# Patient Record
Sex: Female | Born: 1950 | Hispanic: No | Marital: Married | Smoking: Never smoker
Health system: Southern US, Community
[De-identification: ages and names within clinical notes are randomized; demographics above are authoritative.]

---

## 2006-11-09 ENCOUNTER — Emergency Department (HOSPITAL_COMMUNITY): Admission: EM | Admit: 2006-11-09 | Discharge: 2006-11-09 | Payer: Self-pay | Admitting: Family Medicine

## 2007-02-12 ENCOUNTER — Emergency Department (HOSPITAL_COMMUNITY): Admission: EM | Admit: 2007-02-12 | Discharge: 2007-02-12 | Payer: Self-pay | Admitting: Emergency Medicine

## 2007-09-13 ENCOUNTER — Encounter (INDEPENDENT_AMBULATORY_CARE_PROVIDER_SITE_OTHER): Payer: Self-pay | Admitting: Nurse Practitioner

## 2007-09-13 ENCOUNTER — Ambulatory Visit: Payer: Self-pay | Admitting: Internal Medicine

## 2007-09-13 DIAGNOSIS — M67919 Unspecified disorder of synovium and tendon, unspecified shoulder: Secondary | ICD-10-CM | POA: Insufficient documentation

## 2007-09-13 DIAGNOSIS — M25539 Pain in unspecified wrist: Secondary | ICD-10-CM | POA: Insufficient documentation

## 2007-09-13 DIAGNOSIS — M719 Bursopathy, unspecified: Secondary | ICD-10-CM

## 2007-09-14 ENCOUNTER — Encounter (INDEPENDENT_AMBULATORY_CARE_PROVIDER_SITE_OTHER): Payer: Self-pay | Admitting: Nurse Practitioner

## 2007-09-14 LAB — CONVERTED CEMR LAB
AST: 19 units/L (ref 0–37)
Alkaline Phosphatase: 77 units/L (ref 39–117)
BUN: 11 mg/dL (ref 6–23)
Basophils Absolute: 0 10*3/uL (ref 0.0–0.1)
Basophils Relative: 0 % (ref 0–1)
Creatinine, Ser: 0.63 mg/dL (ref 0.40–1.20)
Eosinophils Absolute: 0.1 10*3/uL (ref 0.0–0.7)
HDL: 41 mg/dL (ref >39)
Hemoglobin: 14.2 g/dL (ref 12.0–15.0)
LDL Cholesterol: 108 mg/dL — ABNORMAL HIGH (ref 0–99)
MCHC: 34 g/dL (ref 30.0–36.0)
MCV: 90.3 fL (ref 78.0–100.0)
Monocytes Absolute: 0.4 10*3/uL (ref 0.1–1.0)
Monocytes Relative: 10 % (ref 3–12)
RBC: 4.63 M/uL (ref 3.87–5.11)
RDW: 14.8 % (ref 11.5–15.5)
Total Bilirubin: 0.3 mg/dL (ref 0.3–1.2)
Total CHOL/HDL Ratio: 4.6
VLDL: 40 mg/dL (ref 0–40)

## 2007-09-27 ENCOUNTER — Ambulatory Visit: Payer: Self-pay | Admitting: Nurse Practitioner

## 2007-09-27 ENCOUNTER — Ambulatory Visit (HOSPITAL_COMMUNITY): Admission: RE | Admit: 2007-09-27 | Discharge: 2007-09-27 | Payer: Self-pay | Admitting: Nurse Practitioner

## 2007-09-27 DIAGNOSIS — K219 Gastro-esophageal reflux disease without esophagitis: Secondary | ICD-10-CM

## 2007-10-18 ENCOUNTER — Ambulatory Visit: Payer: Self-pay | Admitting: Nurse Practitioner

## 2007-10-18 DIAGNOSIS — R109 Unspecified abdominal pain: Secondary | ICD-10-CM | POA: Insufficient documentation

## 2007-11-08 ENCOUNTER — Ambulatory Visit (HOSPITAL_COMMUNITY): Admission: RE | Admit: 2007-11-08 | Discharge: 2007-11-08 | Payer: Self-pay | Admitting: Internal Medicine

## 2007-12-05 ENCOUNTER — Ambulatory Visit: Payer: Self-pay | Admitting: Nurse Practitioner

## 2007-12-05 DIAGNOSIS — K7689 Other specified diseases of liver: Secondary | ICD-10-CM

## 2007-12-05 DIAGNOSIS — K8020 Calculus of gallbladder without cholecystitis without obstruction: Secondary | ICD-10-CM | POA: Insufficient documentation

## 2007-12-05 LAB — CONVERTED CEMR LAB
ALT: 23 units/L (ref 0–35)
AST: 23 units/L (ref 0–37)
Alkaline Phosphatase: 79 units/L (ref 39–117)
Glucose, Bld: 104 mg/dL — ABNORMAL HIGH (ref 70–99)
Sodium: 142 meq/L (ref 135–145)
Total Bilirubin: 0.2 mg/dL — ABNORMAL LOW (ref 0.3–1.2)
Total Protein: 7.7 g/dL (ref 6.0–8.3)

## 2007-12-14 ENCOUNTER — Telehealth (INDEPENDENT_AMBULATORY_CARE_PROVIDER_SITE_OTHER): Payer: Self-pay | Admitting: Nurse Practitioner

## 2008-01-07 ENCOUNTER — Encounter (INDEPENDENT_AMBULATORY_CARE_PROVIDER_SITE_OTHER): Payer: Self-pay | Admitting: Nurse Practitioner

## 2008-02-18 ENCOUNTER — Ambulatory Visit: Payer: Self-pay | Admitting: Nurse Practitioner

## 2008-02-18 DIAGNOSIS — F4310 Post-traumatic stress disorder, unspecified: Secondary | ICD-10-CM

## 2008-02-18 DIAGNOSIS — M2550 Pain in unspecified joint: Secondary | ICD-10-CM | POA: Insufficient documentation

## 2008-02-18 LAB — CONVERTED CEMR LAB
ALT: 16 units/L (ref 0–35)
Albumin: 4.2 g/dL (ref 3.5–5.2)
CO2: 25 meq/L (ref 19–32)
Calcium: 8.8 mg/dL (ref 8.4–10.5)
Chloride: 106 meq/L (ref 96–112)
Eosinophils Relative: 3 % (ref 0–5)
HCT: 40.4 % (ref 36.0–46.0)
Hep B E Ab: POSITIVE — AB
Hep B S Ab: POSITIVE — AB
Lymphocytes Relative: 50 % — ABNORMAL HIGH (ref 12–46)
Lymphs Abs: 2.1 10*3/uL (ref 0.7–4.0)
Neutrophils Relative %: 36 % — ABNORMAL LOW (ref 43–77)
Platelets: 288 10*3/uL (ref 150–400)
Sed Rate: 22 mm/hr (ref 0–22)
Sodium: 141 meq/L (ref 135–145)
Total Protein: 7.7 g/dL (ref 6.0–8.3)
Uric Acid, Serum: 5.7 mg/dL (ref 2.4–7.0)
WBC: 4.1 10*3/uL (ref 4.0–10.5)

## 2008-02-20 ENCOUNTER — Encounter (INDEPENDENT_AMBULATORY_CARE_PROVIDER_SITE_OTHER): Payer: Self-pay | Admitting: Nurse Practitioner

## 2008-03-07 ENCOUNTER — Telehealth (INDEPENDENT_AMBULATORY_CARE_PROVIDER_SITE_OTHER): Payer: Self-pay | Admitting: Nurse Practitioner

## 2008-03-25 ENCOUNTER — Encounter (INDEPENDENT_AMBULATORY_CARE_PROVIDER_SITE_OTHER): Payer: Self-pay | Admitting: *Deleted

## 2008-04-21 ENCOUNTER — Ambulatory Visit: Payer: Self-pay | Admitting: Surgery

## 2008-04-21 ENCOUNTER — Encounter (INDEPENDENT_AMBULATORY_CARE_PROVIDER_SITE_OTHER): Payer: Self-pay | Admitting: Internal Medicine

## 2008-04-21 ENCOUNTER — Inpatient Hospital Stay (HOSPITAL_COMMUNITY): Admission: EM | Admit: 2008-04-21 | Discharge: 2008-04-25 | Payer: Self-pay | Admitting: Emergency Medicine

## 2008-10-04 IMAGING — NM NM HEPATO W/GB/PHARM/[PERSON_NAME]
3 series · 13 of 13 positions shown · non-contrast
Comparison: 04/21/2008

CLINICAL DATA: 57-year-old male chest and abdominal pain, vomiting

NUCLEAR MEDICINE HEPATOBILIARY IMAGING
TECHNIQUE: Sequential images of the abdomen were obtained [DATE] minutes following intravenous administration of
radiopharmaceutical.
Radiopharmaceutical:  5mCi Vc-66m Choletec

[Series 1: he hepatobiliary · 3.22mm/px · 6 of 46 frames shown (1 of 2)]
[frame 4/46]
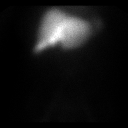
[frame 12/46]
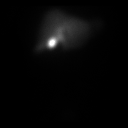
[frame 20/46]
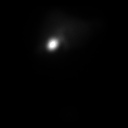
[frame 27/46]
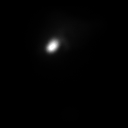
[frame 35/46]
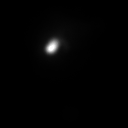
[frame 43/46]
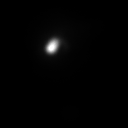

[Series 1: st static image · 1 of 1 slices shown]
[im 1/1]
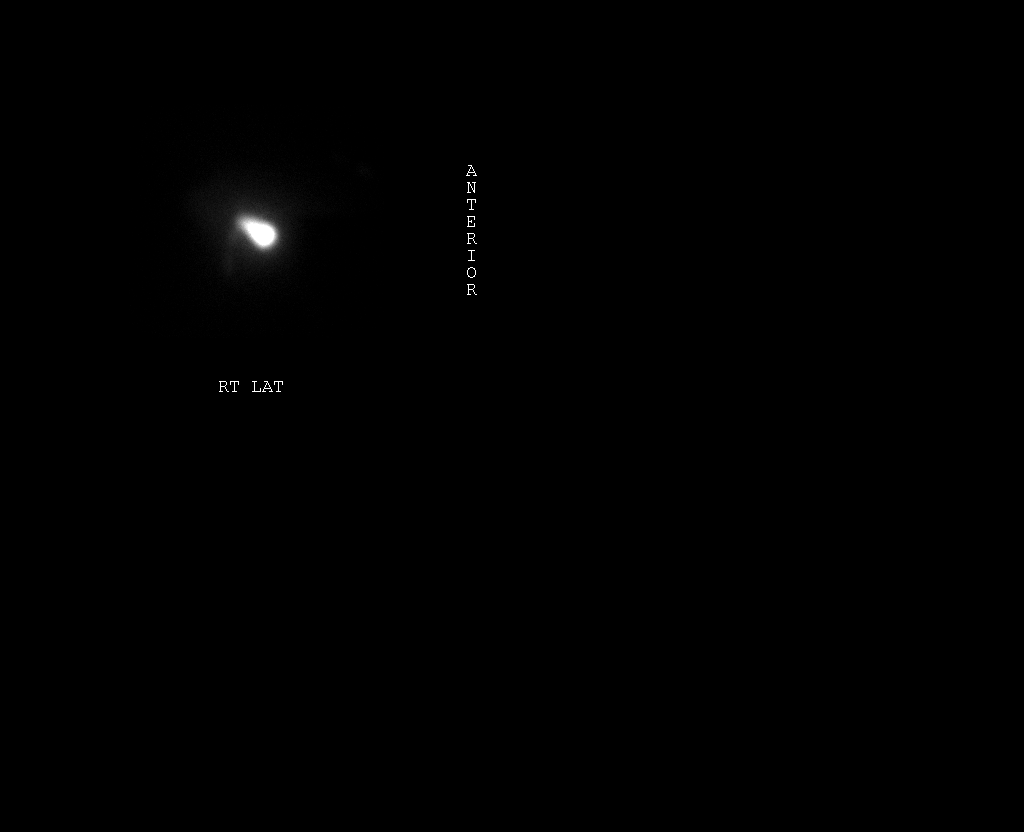

[Series 1: he hepatobiliary · 3.22mm/px · 6 of 60 frames shown (2 of 2)]
[frame 6/60]
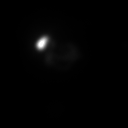
[frame 16/60]
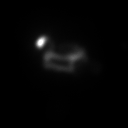
[frame 26/60]
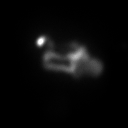
[frame 36/60]
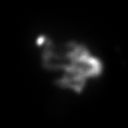
[frame 46/60]
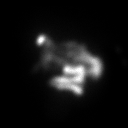
[frame 56/60]
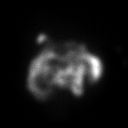

[13 of 13 positions shown; findings below may reference images not displayed]

FINDINGS: There is normal hepatic uptake of the radiotracer and
biliary excretion evident at 10 minutes.  The gallbladder is also
visualized at 10 minutes.

After 60 minutes, half-and-half creamer was administered orally for
gallbladder stimulation.  Gallbladder ejection fraction at 60
minutes was normal measuring 96%. No symptoms were elicited from
the patient.

Normal gallbladder ejection fraction is greater than or equal to
50% of 16 minutes.

 Activity progresses throughout the small bowel.  The cystic duct
and common bile duct are both patent.
IMPRESSION: Patent cystic duct and common bile duct.

Negative for acute cholecystitis.

Normal gallbladder ejection fraction measuring 96%

## 2008-10-20 ENCOUNTER — Ambulatory Visit: Payer: Self-pay | Admitting: Nurse Practitioner

## 2008-10-20 DIAGNOSIS — M25569 Pain in unspecified knee: Secondary | ICD-10-CM

## 2008-10-20 LAB — CONVERTED CEMR LAB
ALT: 15 U/L (ref 0–35)
AST: 13 U/L (ref 0–37)
Albumin: 3.9 g/dL (ref 3.5–5.2)
Alkaline Phosphatase: 71 U/L (ref 39–117)
BUN: 8 mg/dL (ref 6–23)
Basophils Absolute: 0 K/uL (ref 0.0–0.1)
Basophils Relative: 0 % (ref 0–1)
CO2: 23 meq/L (ref 19–32)
Calcium: 8.9 mg/dL (ref 8.4–10.5)
Chloride: 106 meq/L (ref 96–112)
Creatinine, Ser: 0.57 mg/dL (ref 0.40–1.20)
Eosinophils Absolute: 0.1 K/uL (ref 0.0–0.7)
Eosinophils Relative: 2 % (ref 0–5)
Glucose, Bld: 96 mg/dL (ref 70–99)
HCT: 39.6 % (ref 36.0–46.0)
Hemoglobin: 13.2 g/dL (ref 12.0–15.0)
Lymphocytes Relative: 45 % (ref 12–46)
Lymphs Abs: 1.7 K/uL (ref 0.7–4.0)
MCHC: 33.3 g/dL (ref 30.0–36.0)
MCV: 89.6 fL (ref 78.0–100.0)
Monocytes Absolute: 0.4 K/uL (ref 0.1–1.0)
Monocytes Relative: 10 % (ref 3–12)
Neutro Abs: 1.6 K/uL — ABNORMAL LOW (ref 1.7–7.7)
Neutrophils Relative %: 42 % — ABNORMAL LOW (ref 43–77)
Platelets: 280 K/uL (ref 150–400)
Potassium: 4.4 meq/L (ref 3.5–5.3)
RBC: 4.42 M/uL (ref 3.87–5.11)
RDW: 15 % (ref 11.5–15.5)
Sodium: 143 meq/L (ref 135–145)
Total Bilirubin: 0.3 mg/dL (ref 0.3–1.2)
Total Protein: 7.5 g/dL (ref 6.0–8.3)
WBC: 3.7 10*3/microliter — ABNORMAL LOW (ref 4.0–10.5)

## 2009-04-10 ENCOUNTER — Encounter (INDEPENDENT_AMBULATORY_CARE_PROVIDER_SITE_OTHER): Payer: Self-pay | Admitting: Nurse Practitioner

## 2009-04-14 ENCOUNTER — Encounter (INDEPENDENT_AMBULATORY_CARE_PROVIDER_SITE_OTHER): Payer: Self-pay | Admitting: Nurse Practitioner

## 2010-06-28 ENCOUNTER — Ambulatory Visit (HOSPITAL_BASED_OUTPATIENT_CLINIC_OR_DEPARTMENT_OTHER)
Admission: RE | Admit: 2010-06-28 | Discharge: 2010-06-28 | Payer: Self-pay | Source: Home / Self Care | Admitting: Internal Medicine

## 2010-06-28 ENCOUNTER — Ambulatory Visit: Payer: Self-pay | Admitting: Diagnostic Radiology

## 2011-01-11 NOTE — H&P (Signed)
NAMEBERDENA, CISEK    ACCOUNT NO.:  [id number]   MEDICAL RECORD NO.:  [id number]          PATIENT TYPE:  INP   LOCATION:  5530                         FACILITY:  [facility]   PHYSICIAN:  Della Goo, M.D. DATE OF BIRTH:  1951-06-28   DATE OF ADMISSION:  04/21/2008  DATE OF DISCHARGE:                              HISTORY & PHYSICAL   CHIEF COMPLAINT:  Chest pain.   HISTORY OF PRESENT ILLNESS:  This is a 60 year old non-English speaking  female who was brought to the emergency department by ambulance,  secondary to complaints of worsening chest pain that she has had off and  on since Wednesday.  The history and physical interview was done through  the assistance of the interpreter translation line for Swahili, and the  patient's daughter who was at bedside.  The patient reports having  heaviness in her chest, left-sided.  Rate of this pain is being a 10 out  of 10, associated with shortness of breath, lightheadedness and  diaphoresis.  The patient reports having nausea prior to the onset of  her chest pain, followed by 2 episodes of vomiting.  She denies having  any hematemesis.  The patient reports having a previous similar episode  while she was in [place].  The patient reports feeling lightheaded, but  not feeling as though she would pass out.   PAST MEDICAL HISTORY:  None.   MEDICATIONS:  None.   ALLERGIES:  ASPIRIN.   SOCIAL HISTORY:  The patient is a smoker.  She denies drinking alcohol  or any illicit drug usage.   FAMILY HISTORY:  Noncontributory.   PHYSICAL EXAMINATION FINDINGS:  This is a 60 year old well-nourished,  well-developed female in no acute distress.  VITAL SIGNS:  Temperature 97.1, blood pressure initially 150/80, heart  rate 96, respirations 20, O2 saturations 100%.  HEENT EXAMINATION:  Normocephalic and atraumatic.  There is no scleral  icterus.  Pupils equally round and reactive to light.  Extraocular  movements intact.  Funduscopic benign.   Oropharynx clear.  NECK:  Supple; full range of motion.  No thyromegaly, adenopathy or  jugular venous distention.  CARDIOVASCULAR:  Regular rate and rhythm.  No murmurs, gallops or rubs.  LUNGS:  Clear to auscultation bilaterally.  ABDOMEN:  Positive bowel sounds.  Soft, nontender and nondistended.  EXTREMITIES:  Without cyanosis, clubbing or edema.  NEUROLOGIC EXAMINATION:  Nonfocal.   LABORATORY STUDIES:  White blood cell count 5.4, hemoglobin 13.4,  hematocrit 39.9, platelets 234, neutrophils 46%, lymphocytes 41%.  D-  dimer 0.38.  Urine pregnancy test negative.  Sodium 140, potassium 3.9,  chloride 110, bicarb 26, BUN 14, creatinine 0.8, glucose 85.  Cardiac  Enzymes:  Creatinine kinase 81, CK-MB 1.6, troponin 0.01.   CHEST X-RAY:  Reveals decreased lung volumes and bibasilar atelectasis.   EKG:  Reveals a normal sinus rhythm and nonspecific ST segment changes.   ASSESSMENT:  A 60 year old female being admitted with:  1. Chest pain.  2. Transient elevated blood pressures, possibly secondary to      discomfort.  3. Abdominal pain.   PLAN:  The patient will be admitted to a telemetry area for cardiac  monitoring.  Cardiac  enzymes will be performed.  The patient will be  placed on nitrates and oxygen therapy.  The patient reports being  allergic to aspirin, so this will not be ordered.  Low-dose beta blocker  therapy has also been ordered, and DVT and GI prophylaxis have been  ordered.  Further workup will ensue pending the patient's clinical  course and results of her studies.      Della Goo, M.D.  Electronically Signed     HJ/MEDQ  D:  04/21/2008  T:  04/21/2008  Job:  [id number]

## 2011-01-14 NOTE — Discharge Summary (Signed)
NAMEJASMINNE, Olivia Campos    ACCOUNT NO.:  [id number]   MEDICAL RECORD NO.:  [id number]          PATIENT TYPE:  INP   LOCATION:  5530                         FACILITY:  [facility]   PHYSICIAN:  Lonia Blood, M.D.      DATE OF BIRTH:  [birth year]   DATE OF ADMISSION:  04/20/2008  DATE OF DISCHARGE:  04/25/2008                               DISCHARGE SUMMARY   PRIMARY CARE PHYSICIAN:  [facility]   DISCHARGE DIAGNOSES:  1. Nonspecific chest pain with negative findings.  2. Chronic abdominal pain with negative HIDA scan.  3. Generalized aches.  4. Non-English speaking.   DISCHARGE MEDICATIONS:  1. Omeprazole 40 mg daily.  2. INH 300 mg daily.  3. ultram 50 mg daily.   DISPOSITION:  The patient was to resume care with her primary care  physician.  She is taking omeprazole as indicated and is to continue  taking that as necessary.   PROCEDURES PERFORMED:  1. Chest x-ray performed on April 20, 2008, that showed low lung      volumes with bibasilar atelectasis.  2. Abdominal ultrasound on April 21, 2008, that shows unremarkable      findings.  3. HIDA scan on April 25, 2008, that was also negative.  4. A 2-D echocardiogram on April 21, 2008, that shows normal ejection      fraction of 70%.  5. Doppler ultrasound of the lower extremities performed on April 21, 2008, that also shows negative for deep venous thrombosis,      superficial thrombosis, or Baker cyst.   CONSULTATIONS:  None.   BRIEF HISTORY AND PHYSICAL:  Please refer to dictated history and  physical by Dr. Della Goo.  In short, however, this is a 60-year-  old non-English-speaking female that presented with what she described  as chest pain.  She was found to be hypertensive with a blood pressure  of 150/84.  An unknown cholesterol status.  The patient was initially  admitted for chest pain, rule out MI.   HOSPITAL COURSE:  Chest pain.  The  patient was admitted, started on  cardiac enzymes that were all negative x3.  Had a 2-D echo and all  cardiac workup was essentially negative.  It appears the patient's pain  was really not changed, but epigastric and abdominal.  On further  inquiry using interpreter, the patient indicated that she was having  more of abdominal pain.  This has been going on for years even while she  was in [place].  She has had multiple medications on different days but  to no avail.  Subsequently, GI workup was done including HIDA scan and  abdominal ultrasound.  She has previously had a CT abdomen in March,  that was negative.  It did show at that time cholelithiasis only and  some fatty infiltration of the liver.  The patient was subsequently  thought to have probable GERD and was treated with PPI prior to being  discharged home.  She continued to have what she  described as generalized aches.  Her LFTs were normal and no  specific  cause was found to have generalized aches.  She seemed to have improved  with pain medication and PPIs prior to discharge.  Further followup will  be as an outpatient.  She may need a colonoscopy mainly screening, which  may or may not show something tangible.      Lonia Blood, M.D.  Electronically Signed     LG/MEDQ  D:  06/15/2008  T:  06/16/2008  Job:  [id number]

## 2011-06-15 LAB — WET PREP, GENITAL
Clue Cells Wet Prep HPF POC: NONE SEEN
Trich, Wet Prep: NONE SEEN
Yeast Wet Prep HPF POC: NONE SEEN

## 2011-06-15 LAB — GC/CHLAMYDIA PROBE AMP, GENITAL: Chlamydia, DNA Probe: NEGATIVE

## 2011-06-15 LAB — URINALYSIS, ROUTINE W REFLEX MICROSCOPIC
Bilirubin Urine: NEGATIVE
Nitrite: NEGATIVE
Specific Gravity, Urine: 1.015
pH: 6.5

## 2020-01-30 ENCOUNTER — Other Ambulatory Visit: Payer: Self-pay

## 2020-01-30 ENCOUNTER — Emergency Department (HOSPITAL_BASED_OUTPATIENT_CLINIC_OR_DEPARTMENT_OTHER)
Admission: EM | Admit: 2020-01-30 | Discharge: 2020-01-30 | Disposition: A | Discharge status: Home / Self Care | Payer: BC Managed Care – PPO | Attending: Emergency Medicine | Admitting: Emergency Medicine

## 2020-01-30 ENCOUNTER — Emergency Department (HOSPITAL_BASED_OUTPATIENT_CLINIC_OR_DEPARTMENT_OTHER): Payer: BC Managed Care – PPO

## 2020-01-30 ENCOUNTER — Encounter (HOSPITAL_BASED_OUTPATIENT_CLINIC_OR_DEPARTMENT_OTHER): Payer: Self-pay

## 2020-01-30 DIAGNOSIS — R0789 Other chest pain: Secondary | ICD-10-CM | POA: Diagnosis not present

## 2020-01-30 LAB — BASIC METABOLIC PANEL
Anion gap: 9 (ref 5–15)
BUN: 10 mg/dL (ref 8–23)
CO2: 31 mmol/L (ref 22–32)
Calcium: 9.1 mg/dL (ref 8.9–10.3)
Chloride: 100 mmol/L (ref 98–111)
Creatinine, Ser: 0.59 mg/dL (ref 0.44–1.00)
GFR calc Af Amer: >60 mL/min (ref >60)
GFR calc non Af Amer: >60 mL/min (ref >60)
Glucose, Bld: 113 mg/dL — ABNORMAL HIGH (ref 70–99)
Potassium: 4.1 mmol/L (ref 3.5–5.1)
Sodium: 140 mmol/L (ref 135–145)

## 2020-01-30 LAB — TROPONIN I (HIGH SENSITIVITY)
Troponin I (High Sensitivity): 4 ng/L (ref <18)
Troponin I (High Sensitivity): 4 ng/L (ref <18)

## 2020-01-30 LAB — CBC
HCT: 38.7 % (ref 36.0–46.0)
Hemoglobin: 13.1 g/dL (ref 12.0–15.0)
MCH: 30.5 pg (ref 26.0–34.0)
MCHC: 33.9 g/dL (ref 30.0–36.0)
MCV: 90 fL (ref 80.0–100.0)
Platelets: 267 10*3/uL (ref 150–400)
RBC: 4.3 MIL/uL (ref 3.87–5.11)
RDW: 13.9 % (ref 11.5–15.5)
WBC: 3.8 10*3/uL — ABNORMAL LOW (ref 4.0–10.5)
nRBC: 0 % (ref 0.0–0.2)

## 2020-01-30 MED ORDER — ALUM & MAG HYDROXIDE-SIMETH 200-200-20 MG/5ML PO SUSP
30.0000 mL | Freq: Once | ORAL | Status: AC
  Administered 2020-01-30: 30 mL via ORAL
  Filled 2020-01-30: qty 30

## 2020-01-30 MED ORDER — OMEPRAZOLE 20 MG PO CPDR: 20.0000 mg | DELAYED_RELEASE_CAPSULE | Freq: Two times a day (BID) | ORAL | 0 refills | Status: AC

## 2020-01-30 MED ORDER — LIDOCAINE VISCOUS HCL 2 % MT SOLN
15.0000 mL | Freq: Once | OROMUCOSAL | Status: AC
  Administered 2020-01-30: 15 mL via ORAL
  Filled 2020-01-30: qty 15

## 2020-01-30 MED ORDER — SODIUM CHLORIDE 0.9% FLUSH
3.0000 mL | Freq: Once | INTRAVENOUS | Status: DC
  Filled 2020-01-30: qty 3

## 2020-01-30 NOTE — ED Provider Notes (Addendum)
[facility] EMERGENCY DEPARTMENT Provider Note   CSN: [id number] Arrival date & time: 01/30/20  1156     History Chief Complaint  Patient presents with  . Chest Pain    Olivia Campos is a 69 y.o. female with past medical history significant for GERD, posttraumatic stress syndrome, fatty liver disease, positive TB skin test presents to emergency department today with chief complaint of intermittent chest pain x2 weeks.  Patient states pain is a burning sensation in the middle of her chest.  It will occasionally radiate to her neck.  She states pain is worse when eating spicy foods. She takes over-the-counter omeprazole without much symptom improvement.  She rates the pain currently 4 out of 10 in severity.  She denies pain being worse with radiation. She admits to history of similar pain years ago. She has not seen a cardiologist in the past. Denies dyspnea on exertion, SOB, chest tightness or pressure, radiation to left/right arm, jaw, nausea, or diaphoresis. She does not smoke, she denies history of hyperlipidemia, or family history of cardiac disease.   Due to language barrier, a video interpreter was present during the history-taking and subsequent discussion (and for part of the physical exam) with this patient.   History reviewed. No pertinent past medical history.  Patient Active Problem List   Diagnosis Date Noted  . KNEE PAIN, LEFT 10/20/2008  . POST TRAUMATIC STRESS SYNDROME 02/18/2008  . PAIN IN JOINT, MULTIPLE SITES 02/18/2008  . FATTY LIVER DISEASE 12/05/2007  . CHOLELITHIASIS 12/05/2007  . ABDOMINAL PAIN 10/18/2007  . GERD 09/27/2007  . WRIST PAIN, RIGHT 09/13/2007  . BURSITIS, LEFT SHOULDER 09/13/2007  . TB SKIN TEST, POSITIVE 09/13/2007    History reviewed. No pertinent surgical history.   OB History   No obstetric history on file.     No family history on file.  Social History   Tobacco Use  . Smoking status: Never Smoker  .  Smokeless tobacco: Never Used  Substance Use Topics  . Alcohol use: Never  . Drug use: Never    Home Medications Prior to Admission medications   Medication Sig Start Date End Date Taking? Authorizing Provider  omeprazole (PRILOSEC) 20 MG capsule Take 1 capsule (20 mg total) by mouth 2 (two) times daily before a meal for 28 days. 01/30/20 02/27/20  Tenicia Gural, Caroleen Hamman, PA-C    Allergies    Patient has no known allergies.  Review of Systems   Review of Systems  All other systems are reviewed and are negative for acute change except as noted in the HPI.   Physical Exam Updated Vital Signs BP (!) 186/87   Pulse 68   Temp 98.2 F (36.8 C) (Oral)   Resp 12   Wt 64 kg   SpO2 98%   Physical Exam Vitals and nursing note reviewed.  Constitutional:      General: She is not in acute distress.    Appearance: She is not ill-appearing.  HENT:     Head: Normocephalic and atraumatic.     Right Ear: Tympanic membrane and external ear normal.     Left Ear: Tympanic membrane and external ear normal.     Nose: Nose normal.     Mouth/Throat:     Mouth: Mucous membranes are moist.     Pharynx: Oropharynx is clear.  Eyes:     General: No scleral icterus.       Right eye: No discharge.        Left eye:  No discharge.     Extraocular Movements: Extraocular movements intact.     Conjunctiva/sclera: Conjunctivae normal.     Pupils: Pupils are equal, round, and reactive to light.  Neck:     Vascular: No JVD.  Cardiovascular:     Rate and Rhythm: Normal rate and regular rhythm.     Pulses: Normal pulses.          Radial pulses are 2+ on the right side and 2+ on the left side.       Dorsalis pedis pulses are 2+ on the right side and 2+ on the left side.     Heart sounds: Normal heart sounds.  Pulmonary:     Comments: Lungs clear to auscultation in all fields. Symmetric chest rise. No wheezing, rales, or rhonchi. Chest:     Chest wall: No tenderness.  Abdominal:     Palpations: There is no  mass.     Hernia: No hernia is present.     Comments: Abdomen is soft, non-distended, and non-tender in all quadrants. No rigidity, no guarding. No peritoneal signs.  Musculoskeletal:        General: Normal range of motion.     Cervical back: Normal range of motion.     Right lower leg: No edema.     Left lower leg: No edema.  Skin:    General: Skin is warm and dry.     Capillary Refill: Capillary refill takes less than 2 seconds.  Neurological:     Mental Status: She is oriented to person, place, and time.     GCS: GCS eye subscore is 4. GCS verbal subscore is 5. GCS motor subscore is 6.     Comments: Fluent speech, no facial droop.  Psychiatric:        Behavior: Behavior normal.     ED Results / Procedures / Treatments   Labs (all labs ordered are listed, but only abnormal results are displayed) Labs Reviewed  BASIC METABOLIC PANEL - Abnormal; Notable for the following components:      Result Value   Glucose, Bld 113 (*)    All other components within normal limits  CBC - Abnormal; Notable for the following components:   WBC 3.8 (*)    All other components within normal limits  TROPONIN I (HIGH SENSITIVITY)  TROPONIN I (HIGH SENSITIVITY)    EKG None  Radiology DG Chest 2 View  Result Date: 01/30/2020 CLINICAL DATA:  Chest pain EXAM: CHEST - 2 VIEW COMPARISON:  03/01/2015 FINDINGS: The heart size and mediastinal contours are stable. No focal airspace consolidation, pleural effusion, or pneumothorax. The visualized skeletal structures are unremarkable. IMPRESSION: No active cardiopulmonary disease. Electronically Signed   By: Duanne Guess D.O.   On: 01/30/2020 13:15    Procedures Procedures (including critical care time)  Medications Ordered in ED Medications  sodium chloride flush (NS) 0.9 % injection 3 mL (3 mLs Intravenous Not Given 01/30/20 1356)  alum & mag hydroxide-simeth (MAALOX/MYLANTA) 200-200-20 MG/5ML suspension 30 mL (30 mLs Oral Given 01/30/20 1521)     And  lidocaine (XYLOCAINE) 2 % viscous mouth solution 15 mL (15 mLs Oral Given 01/30/20 1521)    ED Course  I have reviewed the triage vital signs and the nursing notes.  Pertinent labs & imaging results that were available during my care of the patient were reviewed by me and considered in my medical decision making (see chart for details).    MDM Rules/Calculators/A&P  History provided by patient and daughter with additional history obtained from chart review.    Patient presents to the emergency department with chest pain. Patient nontoxic appearing, in no apparent distress, vitals without significant abnormality. Fairly benign physical exam. DDX: ACS, pulmonary embolism, dissection, pneumothorax, effusion, infiltrate, arrhythmia, anemia, electrolyte derangement, MSK. Evaluation initiated with labs, EKG, and CXR. Patient on cardiac monitor.   Work-up in the ER unremarkable. Labs reviewed, no leukocytosis, anemia, or significant electrolyte abnormality. CXR without infiltrate, effusion, pneumothorax, or fracture/dislocation.   Low risk heart score of 2, EKG without obvious ischemia, delta troponin negative, doubt ACS. Patient is low risk wells and not consistent with PE,  doubt pulmonary embolism. Pain is not a tearing sensation, symmetric pulses, no widening of mediastinum on CXR, doubt dissection. Cardiac monitor reviewed, no notable arrhythmias or tachycardia. Patient has appeared hemodynamically stable throughout ER visit and appears safe for discharge with close PCP/cardiology follow up. I discussed results, treatment plan, need for PCP follow-up, and return precautions with the patient. Provided opportunity for questions, patient confirmed understanding and is in agreement with plan.      Final Clinical Impression(s) / ED Diagnoses Final diagnoses:  Atypical chest pain    Rx / DC Orders ED Discharge Orders         Ordered    omeprazole (PRILOSEC) 20 MG capsule   2 times daily before meals     01/30/20 1708           Railynn Ballo, [place], PA-C 01/30/20 1712    Cherre Robins, PA-C 01/30/20 1713    Tegeler, Gwenyth Allegra, MD 01/30/20 [phone]

## 2020-01-30 NOTE — Discharge Instructions (Signed)
Read instructions below for reasons to return to the Emergency Department. It is recommended that your follow up with your Primary Care Doctor in regards to today's visit. If you do not have a doctor, use the resource guide to help you find one.   -Scription sent to your pharmacy for Prilosec.  This is used to treat heartburn.  Please take as prescribed.  Tests performed today include: An EKG of your heart A chest x-ray Cardiac enzymes - a blood test for heart muscle damage Blood counts and electrolytes  Chest Pain (Nonspecific)  HOME CARE INSTRUCTIONS  -For the next few days, avoid physical activities that bring on chest pain. Continue physical activities as directed.  -Do not smoke cigarettes or drink alcohol until your symptoms are gone. If you do smoke, it is time to quit. You may receive instructions and counseling on how to stop smoking. Only take over-the-counter or prescription medicine for pain, discomfort, or fever as directed by your caregiver.  -Follow your caregiver's suggestions for further testing if your chest pain does not go away.  -Keep any follow-up appointments you made. If you do not go to an appointment, you could develop lasting (chronic) problems with pain. If there is any problem keeping an appointment, you must call to reschedule.   SEEK MEDICAL CARE IF:  You think you are having problems from the medicine you are taking. Read your medicine instructions carefully.  Your chest pain does not go away, even after treatment.  You develop a rash with blisters on your chest.   SEEK IMMEDIATE MEDICAL CARE IF:  You have increased chest pain or pain that spreads to your arm, neck, jaw, back, or belly (abdomen).  You develop shortness of breath, an increasing cough, or you are coughing up blood.  You have severe back or abdominal pain, feel sick to your stomach (nauseous) or throw up (vomit).  You develop severe weakness, fainting, or chills.  You have an oral temperature  above 102 F (38.9 C), not controlled by medicine.   THIS IS AN EMERGENCY. Do not wait to see if the pain will go away. Get medical help at once. Call 911. Do not drive yourself to the hospital.

## 2020-01-30 NOTE — ED Triage Notes (Signed)
Per daughter/interpreter-pt with CP that radiates to back x 2 weeks-NAD-steady gait

## 2020-07-10 IMAGING — CR DG CHEST 2V
2 series · 2 of 2 positions shown · non-contrast
Comparison: 03/01/2015

CLINICAL DATA: Chest pain

EXAM:
CHEST - 2 VIEW

[w chest pa]
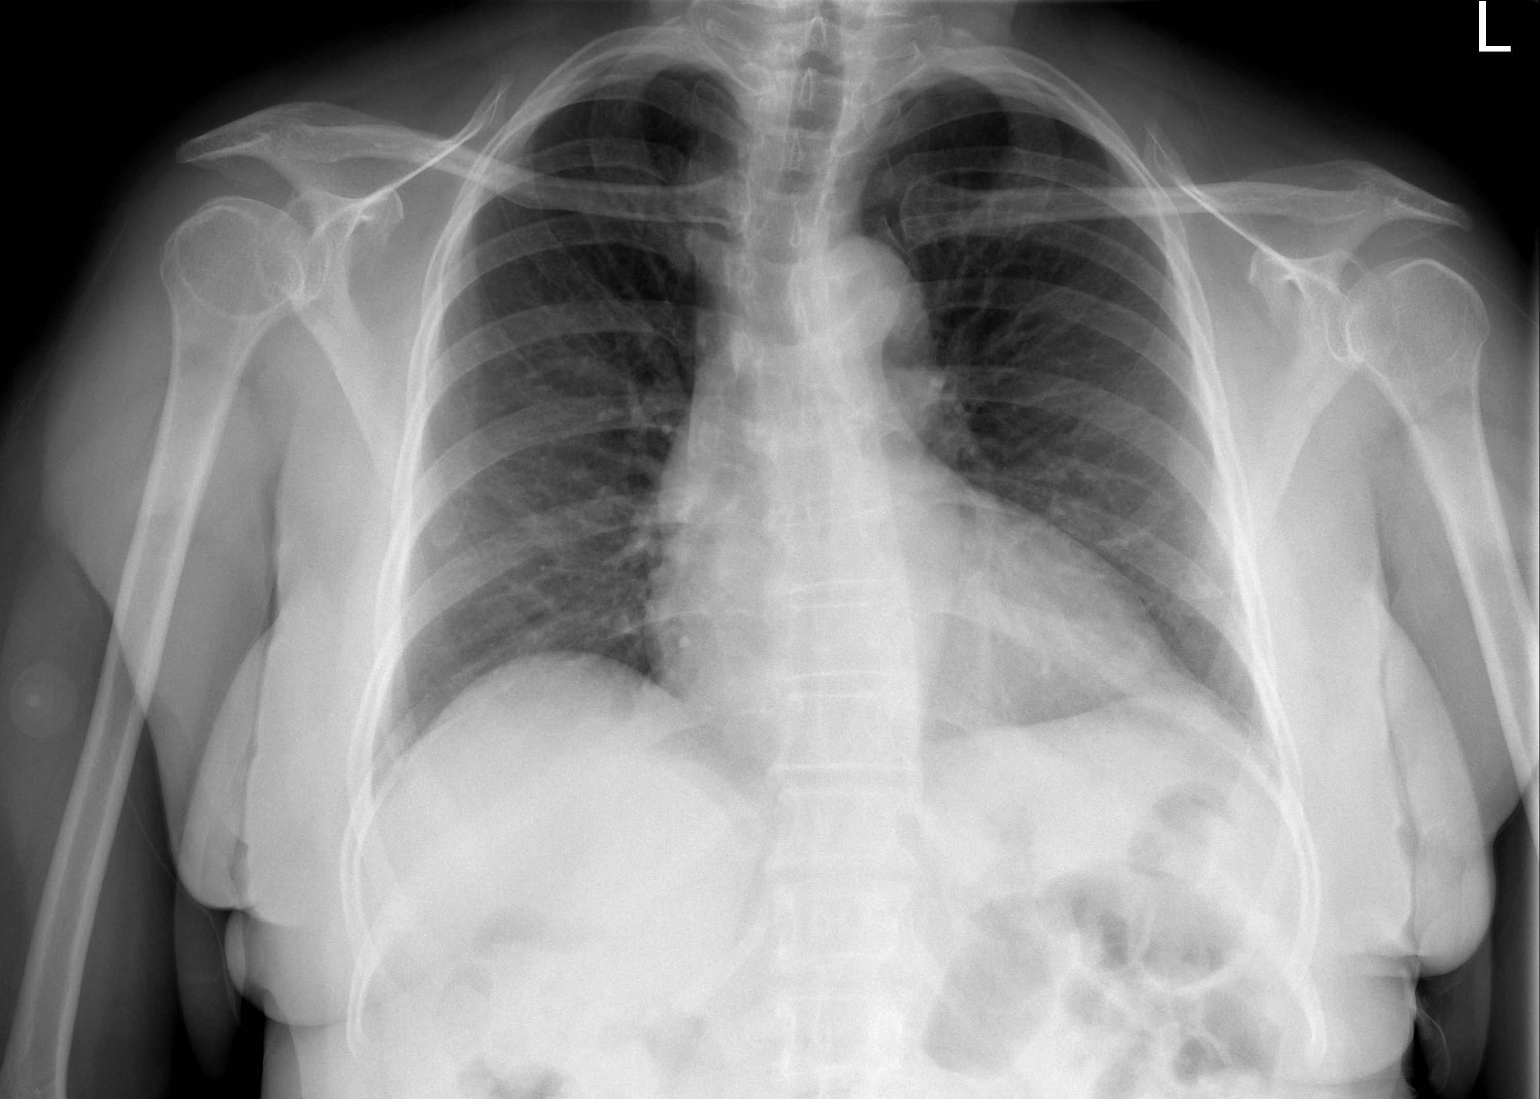

[w chest lat]
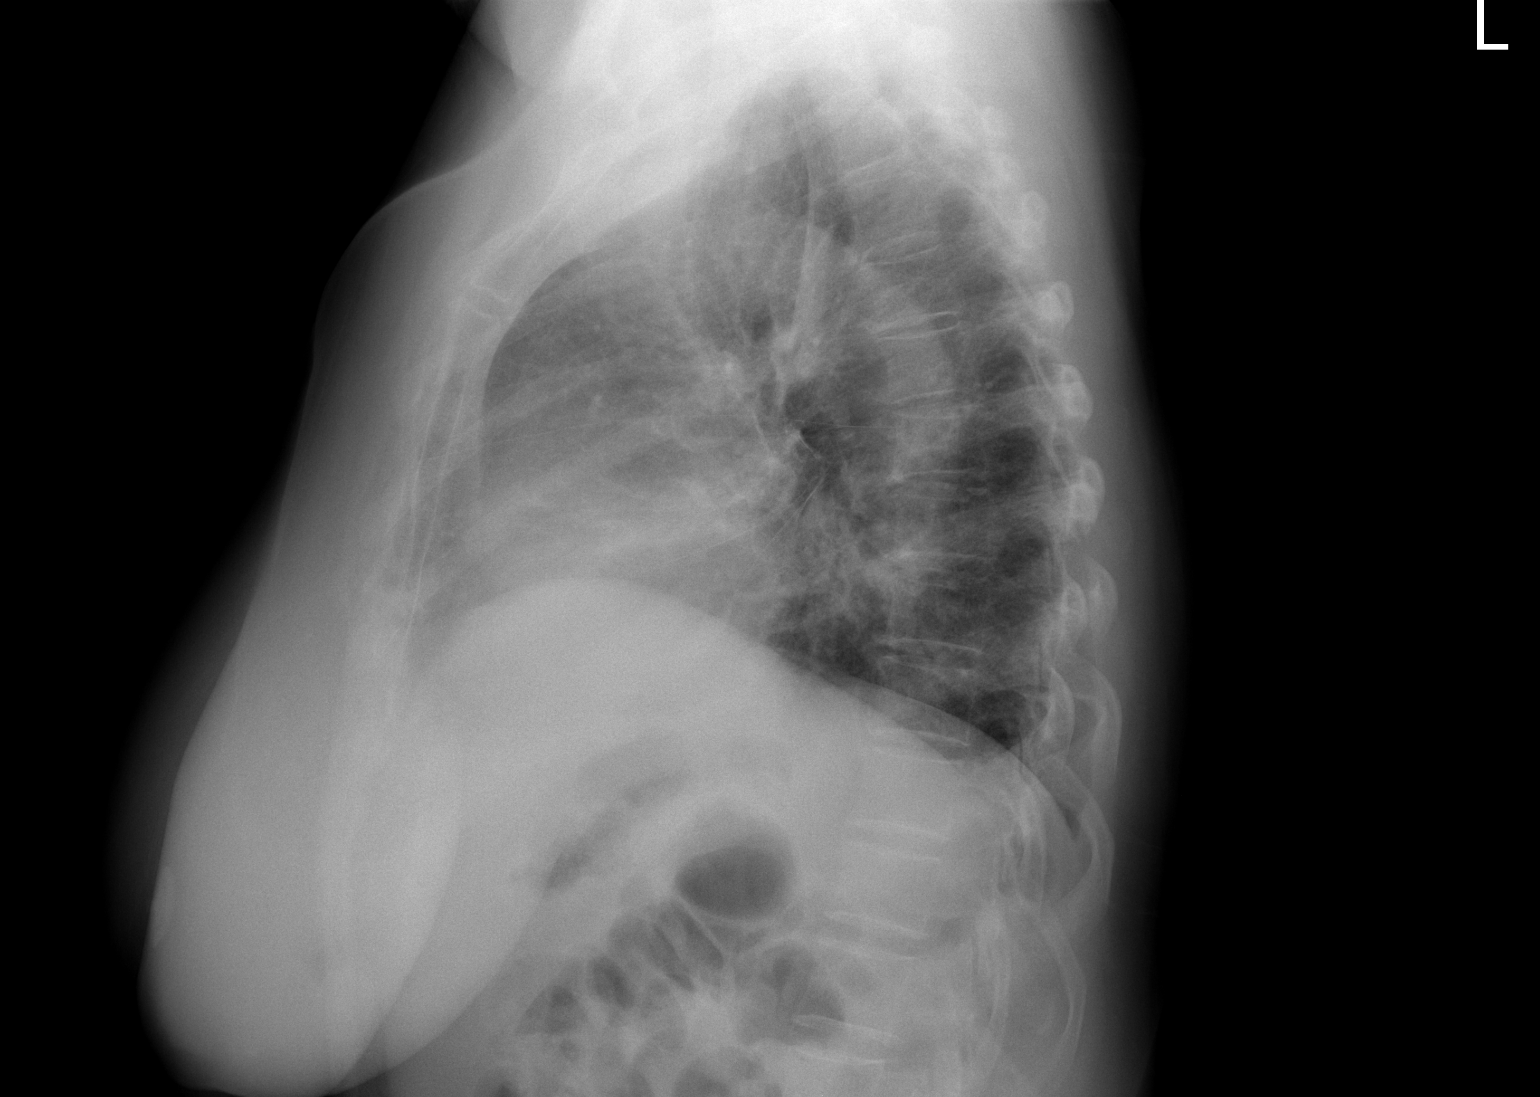

[2 of 2 positions shown; findings below may reference images not displayed]

FINDINGS: The heart size and mediastinal contours are stable. No focal
airspace consolidation, pleural effusion, or pneumothorax. The
visualized skeletal structures are unremarkable.
IMPRESSION: No active cardiopulmonary disease.
